# Patient Record
Sex: Female | Born: 1950 | Race: Black or African American | Hispanic: No | Marital: Single | Smoking: Current every day smoker
Health system: Southern US, Community
[De-identification: ages and names within clinical notes are randomized; demographics above are authoritative.]

## PROBLEM LIST (undated history)

## (undated) DIAGNOSIS — I10 Essential (primary) hypertension: Secondary | ICD-10-CM

## (undated) DIAGNOSIS — J45909 Unspecified asthma, uncomplicated: Secondary | ICD-10-CM

## (undated) DIAGNOSIS — J309 Allergic rhinitis, unspecified: Secondary | ICD-10-CM

## (undated) DIAGNOSIS — E785 Hyperlipidemia, unspecified: Secondary | ICD-10-CM

## (undated) HISTORY — PX: TUBAL LIGATION: SHX77

## (undated) HISTORY — PX: ROTATOR CUFF REPAIR: SHX139

## (undated) HISTORY — DX: Allergic rhinitis, unspecified: J30.9

## (undated) HISTORY — PX: ADENOIDECTOMY: SUR15

## (undated) HISTORY — PX: JOINT REPLACEMENT: SHX530

## (undated) HISTORY — PX: TONSILLECTOMY: SUR1361

## (undated) HISTORY — PX: REPLACEMENT TOTAL KNEE: SUR1224

---

## 2012-05-12 ENCOUNTER — Encounter (HOSPITAL_BASED_OUTPATIENT_CLINIC_OR_DEPARTMENT_OTHER): Payer: Self-pay | Admitting: *Deleted

## 2012-05-12 ENCOUNTER — Emergency Department (HOSPITAL_BASED_OUTPATIENT_CLINIC_OR_DEPARTMENT_OTHER)
Admission: EM | Admit: 2012-05-12 | Discharge: 2012-05-13 | Disposition: A | Discharge status: Home / Self Care | Payer: PRIVATE HEALTH INSURANCE | Attending: Emergency Medicine | Admitting: Emergency Medicine

## 2012-05-12 DIAGNOSIS — J069 Acute upper respiratory infection, unspecified: Secondary | ICD-10-CM

## 2012-05-12 DIAGNOSIS — I10 Essential (primary) hypertension: Secondary | ICD-10-CM | POA: Insufficient documentation

## 2012-05-12 DIAGNOSIS — R0982 Postnasal drip: Secondary | ICD-10-CM

## 2012-05-12 DIAGNOSIS — E785 Hyperlipidemia, unspecified: Secondary | ICD-10-CM | POA: Insufficient documentation

## 2012-05-12 DIAGNOSIS — E119 Type 2 diabetes mellitus without complications: Secondary | ICD-10-CM | POA: Insufficient documentation

## 2012-05-12 DIAGNOSIS — F17200 Nicotine dependence, unspecified, uncomplicated: Secondary | ICD-10-CM | POA: Insufficient documentation

## 2012-05-12 HISTORY — DX: Hyperlipidemia, unspecified: E78.5

## 2012-05-12 HISTORY — DX: Essential (primary) hypertension: I10

## 2012-05-12 HISTORY — DX: Unspecified asthma, uncomplicated: J45.909

## 2012-05-12 MED ORDER — IBUPROFEN 400 MG PO TABS
400.0000 mg | ORAL_TABLET | Freq: Once | ORAL | Status: AC
  Administered 2012-05-12: 400 mg via ORAL
  Filled 2012-05-12: qty 1

## 2012-05-12 MED ORDER — DESLORATADINE 5 MG PO TABS: 5.0000 mg | ORAL_TABLET | Freq: Every day | ORAL | Status: DC

## 2012-05-12 MED ORDER — FLUTICASONE PROPIONATE 50 MCG/ACT NA SUSP: 2.0000 | Freq: Every day | NASAL | Status: AC

## 2012-05-12 NOTE — ED Provider Notes (Signed)
History     CSN: [id number]  Arrival date & time 05/12/12  2318   First MD Initiated Contact with Patient 05/12/12 2343      Chief Complaint  Patient presents with  . URI    (Consider location/radiation/quality/duration/timing/severity/associated sxs/prior treatment) Patient is a 61 y.o. female presenting with URI. The history is provided by the patient. No language interpreter was used.  URI The primary symptoms include headaches, ear pain and sore throat. Primary symptoms do not include fever, fatigue, swollen glands, cough, wheezing, abdominal pain, nausea, vomiting, myalgias, arthralgias or rash. The current episode started 3 to 5 days ago. This is a new problem. The problem has not changed since onset. The ear pain began more than 2 days ago. Ear pain is a new problem. The ear pain has been unchanged since its onset. Both ears are affected. The pain is moderate.    Symptoms associated with the illness include plugged ear sensation, congestion and rhinorrhea. The illness is not associated with chills, facial pain or sinus pressure. Risk factors for severe complications from URI include being elderly.    Past Medical History  Diagnosis Date  . Hypertension   . Diabetes mellitus   . Hyperlipemia   . Asthma     Past Surgical History  Procedure Date  . Joint replacement   . Tubal ligation   . Tonsillectomy     History reviewed. No pertinent family history.  History  Substance Use Topics  . Smoking status: Current Some Day Smoker  . Smokeless tobacco: Not on file  . Alcohol Use: No    OB History    Grav Para Term Preterm Abortions TAB SAB Ect Mult Living                  Review of Systems  Constitutional: Negative for fever, chills and fatigue.  HENT: Positive for ear pain, congestion, sore throat and rhinorrhea. Negative for sinus pressure.   Respiratory: Negative for cough and wheezing.   Gastrointestinal: Negative for nausea, vomiting and abdominal pain.    Musculoskeletal: Negative for myalgias and arthralgias.  Skin: Negative for rash.  Neurological: Positive for headaches.  All other systems reviewed and are negative.    Allergies  Review of patient's allergies indicates no known allergies.  Home Medications  No current outpatient prescriptions on file.  BP 164/89  Pulse 91  Temp 98.1 F (36.7 C) (Oral)  Resp 16  Ht 5\' 9"  (1.753 m)  Wt 300 lb (136.079 kg)  BMI 44.30 kg/m2  SpO2 100%  Physical Exam  Constitutional: She is oriented to person, place, and time. She appears well-developed and well-nourished.  HENT:  Head: Normocephalic and atraumatic.  Mouth/Throat: Oropharynx is clear and moist.  Eyes: EOM are normal. Pupils are equal, round, and reactive to light.  Neck: Normal range of motion. Neck supple.  Cardiovascular: Normal rate and regular rhythm.   Pulmonary/Chest: Effort normal and breath sounds normal. She has no wheezes. She has no rales.  Abdominal: Soft. Bowel sounds are normal. There is no tenderness. There is no rebound and no guarding.  Musculoskeletal: Normal range of motion. She exhibits no edema.  Neurological: She is alert and oriented to person, place, and time.  Skin: Skin is warm and dry.  Psychiatric: She has a normal mood and affect.    ED Course  Procedures (including critical care time)  Labs Reviewed - No data to display No results found.   No diagnosis found.    MDM  URI, return for fevers cough productive of sputum inability to swallow or any concerns         Tahlia Deamer K Hamzeh Tall-Rasch, MD 05/12/12 2352

## 2012-05-12 NOTE — ED Notes (Signed)
Pt c/o URI symptoms x 3 days 

## 2012-05-12 NOTE — ED Notes (Signed)
MD at bedside. 

## 2014-03-22 DIAGNOSIS — M1710 Unilateral primary osteoarthritis, unspecified knee: Secondary | ICD-10-CM | POA: Insufficient documentation

## 2014-07-15 DIAGNOSIS — M1711 Unilateral primary osteoarthritis, right knee: Secondary | ICD-10-CM | POA: Insufficient documentation

## 2014-07-15 DIAGNOSIS — D649 Anemia, unspecified: Secondary | ICD-10-CM | POA: Insufficient documentation

## 2014-07-15 DIAGNOSIS — J449 Chronic obstructive pulmonary disease, unspecified: Secondary | ICD-10-CM | POA: Insufficient documentation

## 2015-11-21 DIAGNOSIS — K219 Gastro-esophageal reflux disease without esophagitis: Secondary | ICD-10-CM | POA: Insufficient documentation

## 2015-11-21 DIAGNOSIS — Z794 Long term (current) use of insulin: Secondary | ICD-10-CM

## 2015-11-21 DIAGNOSIS — M150 Primary generalized (osteo)arthritis: Secondary | ICD-10-CM

## 2015-11-21 DIAGNOSIS — M159 Polyosteoarthritis, unspecified: Secondary | ICD-10-CM | POA: Insufficient documentation

## 2015-11-21 DIAGNOSIS — D126 Benign neoplasm of colon, unspecified: Secondary | ICD-10-CM | POA: Insufficient documentation

## 2015-11-21 DIAGNOSIS — E782 Mixed hyperlipidemia: Secondary | ICD-10-CM | POA: Insufficient documentation

## 2015-11-21 DIAGNOSIS — M109 Gout, unspecified: Secondary | ICD-10-CM | POA: Insufficient documentation

## 2015-11-21 DIAGNOSIS — E1142 Type 2 diabetes mellitus with diabetic polyneuropathy: Secondary | ICD-10-CM | POA: Insufficient documentation

## 2015-11-21 DIAGNOSIS — E66813 Obesity, class 3: Secondary | ICD-10-CM | POA: Insufficient documentation

## 2015-11-21 DIAGNOSIS — I10 Essential (primary) hypertension: Secondary | ICD-10-CM | POA: Insufficient documentation

## 2015-11-21 DIAGNOSIS — J45909 Unspecified asthma, uncomplicated: Secondary | ICD-10-CM | POA: Insufficient documentation

## 2015-12-26 DIAGNOSIS — M79672 Pain in left foot: Secondary | ICD-10-CM | POA: Insufficient documentation

## 2015-12-27 DIAGNOSIS — M67972 Unspecified disorder of synovium and tendon, left ankle and foot: Secondary | ICD-10-CM | POA: Insufficient documentation

## 2015-12-27 DIAGNOSIS — M7752 Other enthesopathy of left foot: Secondary | ICD-10-CM | POA: Insufficient documentation

## 2016-01-10 DIAGNOSIS — M76822 Posterior tibial tendinitis, left leg: Secondary | ICD-10-CM | POA: Insufficient documentation

## 2016-01-30 DIAGNOSIS — J309 Allergic rhinitis, unspecified: Secondary | ICD-10-CM | POA: Insufficient documentation

## 2016-03-01 DIAGNOSIS — F17209 Nicotine dependence, unspecified, with unspecified nicotine-induced disorders: Secondary | ICD-10-CM | POA: Insufficient documentation

## 2016-08-20 DIAGNOSIS — N200 Calculus of kidney: Secondary | ICD-10-CM | POA: Insufficient documentation

## 2016-08-20 DIAGNOSIS — K7689 Other specified diseases of liver: Secondary | ICD-10-CM | POA: Insufficient documentation

## 2016-08-20 DIAGNOSIS — D259 Leiomyoma of uterus, unspecified: Secondary | ICD-10-CM | POA: Insufficient documentation

## 2016-10-30 DIAGNOSIS — R0981 Nasal congestion: Secondary | ICD-10-CM | POA: Insufficient documentation

## 2016-12-12 ENCOUNTER — Emergency Department (HOSPITAL_BASED_OUTPATIENT_CLINIC_OR_DEPARTMENT_OTHER)
Admission: EM | Admit: 2016-12-12 | Discharge: 2016-12-12 | Disposition: A | Discharge status: Home / Self Care | Payer: Medicare Other | Attending: Emergency Medicine | Admitting: Emergency Medicine

## 2016-12-12 ENCOUNTER — Encounter (HOSPITAL_BASED_OUTPATIENT_CLINIC_OR_DEPARTMENT_OTHER): Payer: Self-pay | Admitting: *Deleted

## 2016-12-12 DIAGNOSIS — Z7982 Long term (current) use of aspirin: Secondary | ICD-10-CM | POA: Diagnosis not present

## 2016-12-12 DIAGNOSIS — Z79899 Other long term (current) drug therapy: Secondary | ICD-10-CM | POA: Diagnosis not present

## 2016-12-12 DIAGNOSIS — F17200 Nicotine dependence, unspecified, uncomplicated: Secondary | ICD-10-CM | POA: Insufficient documentation

## 2016-12-12 DIAGNOSIS — E119 Type 2 diabetes mellitus without complications: Secondary | ICD-10-CM | POA: Insufficient documentation

## 2016-12-12 DIAGNOSIS — I10 Essential (primary) hypertension: Secondary | ICD-10-CM | POA: Diagnosis not present

## 2016-12-12 DIAGNOSIS — J45909 Unspecified asthma, uncomplicated: Secondary | ICD-10-CM | POA: Diagnosis not present

## 2016-12-12 DIAGNOSIS — Z794 Long term (current) use of insulin: Secondary | ICD-10-CM | POA: Insufficient documentation

## 2016-12-12 DIAGNOSIS — M546 Pain in thoracic spine: Secondary | ICD-10-CM | POA: Insufficient documentation

## 2016-12-12 MED ORDER — METHOCARBAMOL 500 MG PO TABS: 500.0000 mg | ORAL_TABLET | Freq: Two times a day (BID) | ORAL | 0 refills | Status: DC

## 2016-12-12 MED FILL — tiZANidine HCL 4 MG TABS: 4 | 10 days supply | Qty: 20 | Fill #0

## 2016-12-12 NOTE — Discharge Instructions (Signed)
Take the prescribed medication as directed.  Recommend to use heat therapy to help ease muscle tension as well (heating pad, warm compress, warm bath/shower, etc.) Follow-up with your doctor if not improving in the next few days. Return to the ED for new or worsening symptoms.

## 2016-12-12 NOTE — ED Provider Notes (Signed)
[facility] Provider Note   CSN: [id number] Arrival date & time: 12/12/16  0907     History   Chief Complaint Chief Complaint  Patient presents with  . Back Pain    HPI Sharon Morales is a 66 y.o. female.  The history is provided by the patient and medical records.  Back Pain    66 year old female with history of asthma, diabetes, hyperlipidemia, hypertension, presenting to the ED for right mid back pain. States this began on Sunday night. She denies any injury, trauma, or falls. States it feels like she has a "crick" in her back. She was concerned she may have slept wrong. States pain is localized to the right mid back without radiation. She denies any numbness or weakness of her arms or legs. No fever or chills. No urinary symptoms. No abdominal pain. States she has been taking some Tylenol which eases her pain briefly, it is never fully resolved.  Past Medical History:  Diagnosis Date  . Asthma   . Diabetes mellitus   . Hyperlipemia   . Hypertension     There are no active problems to display for this patient.   Past Surgical History:  Procedure Laterality Date  . JOINT REPLACEMENT    . TONSILLECTOMY    . TUBAL LIGATION      OB History    No data available       Home Medications    Prior to Admission medications   Medication Sig Start Date End Date Taking? Authorizing Provider  albuterol (PROVENTIL HFA;VENTOLIN HFA) 108 (90 BASE) MCG/ACT inhaler Inhale 1 puff into the lungs every 4 (four) hours as needed.    Historical Provider, MD  aspirin 81 MG tablet Take 81 mg by mouth daily.    Historical Provider, MD  desloratadine (CLARINEX) 5 MG tablet Take 1 tablet (5 mg total) by mouth daily. 05/12/12 05/12/13  April Palumbo, MD  fluticasone [facility]) 50 MCG/ACT nasal spray Place 2 sprays into the nose daily. 05/12/12 05/12/13  April Palumbo, MD  hydrochlorothiazide (HYDRODIURIL) 25 MG tablet Take 25 mg by mouth daily.    Historical Provider, MD  insulin detemir  (LEVEMIR) 100 UNIT/ML injection Inject 50 Units into the skin at bedtime.    Historical Provider, MD  lisinopril (PRINIVIL,ZESTRIL) 10 MG tablet Take 10 mg by mouth daily.    Historical Provider, MD  metFORMIN (GLUCOPHAGE) 1000 MG tablet Take 1,000 mg by mouth 2 (two) times daily with a meal.    Historical Provider, MD  omeprazole (PRILOSEC) 20 MG capsule Take 20 mg by mouth daily.    Historical Provider, MD  potassium chloride (KLOR-CON) 20 MEQ packet Take 20 mEq by mouth daily.    Historical Provider, MD  rosuvastatin (CRESTOR) 20 MG tablet Take 20 mg by mouth daily.    Historical Provider, MD  zolpidem (AMBIEN) 10 MG tablet Take 10 mg by mouth at bedtime as needed.    Historical Provider, MD    Family History History reviewed. No pertinent family history.  Social History Social History  Substance Use Topics  . Smoking status: Current Some Day Smoker  . Smokeless tobacco: Never Used  . Alcohol use No     Allergies   Patient has no known allergies.   Review of Systems Review of Systems  Musculoskeletal: Positive for back pain.  All other systems reviewed and are negative.    Physical Exam Updated Vital Signs BP 148/77 (BP Location: Right Arm)   Pulse 88   Temp  78 F (36.7 C) (Oral)   Resp 18   SpO2 100%   Physical Exam  Constitutional: She is oriented to person, place, and time. She appears well-developed and well-nourished.  HENT:  Head: Normocephalic and atraumatic.  Mouth/Throat: Oropharynx is clear and moist.  Eyes: Conjunctivae and EOM are normal. Pupils are equal, round, and reactive to light.  Neck: Normal range of motion.  Cardiovascular: Normal rate, regular rhythm and normal heart sounds.   Pulmonary/Chest: Effort normal and breath sounds normal. No respiratory distress. She has no wheezes.  Abdominal: Soft. Bowel sounds are normal. There is no tenderness. There is no rebound and no CVA tenderness.  No abdominal or CVA tenderness  Musculoskeletal: Normal  range of motion.  Muscular tenderness of right thoracic paraspinal muscles; no midline tenderness, deformity, or step-off; full ROM maintained; normal strength and sensation of all 4 extremities, normal gait  Neurological: She is alert and oriented to person, place, and time.  Skin: Skin is warm and dry.  Psychiatric: She has a normal mood and affect.  Nursing note and vitals reviewed.    ED Treatments / Results  Labs (all labs ordered are listed, but only abnormal results are displayed) Labs Reviewed - No data to display  EKG  EKG Interpretation None       Radiology No results found.  Procedures Procedures (including critical care time)  Medications Ordered in ED Medications - No data to display   Initial Impression / Assessment and Plan / ED Course  I have reviewed the triage vital signs and the nursing notes.  Pertinent labs & imaging results that were available during my care of the patient were reviewed by me and considered in my medical decision making (see chart for details).  66 year old female here with atraumatic right thoracic back pain.  Tenderness over the musculature, no midline tenderness or step-off. She is neurologically intact. No radicular type symptoms. She is ambulatory with steady gait. No abdominal pain, CVA tenderness, or urinary symptoms. Patient describes pain as a "crick" in her back. Suspect this is muscular in nature. Will treat symptomatically.  I recommended that she follow-up closely with her primary care doctor if not improving in the next few days.  Discussed plan with patient, she acknowledged understanding and agreed with plan of care.  Return precautions given for new or worsening symptoms.  Final Clinical Impressions(s) / ED Diagnoses   Final diagnoses:  Acute right-sided thoracic back pain    New Prescriptions New Prescriptions   METHOCARBAMOL (ROBAXIN) 500 MG TABLET    Take 1 tablet (500 mg total) by mouth 2 (two) times daily.      Larene Pickett, PA-C 12/12/16 [place], MD 12/12/16 1236

## 2016-12-12 NOTE — ED Triage Notes (Signed)
Pt reports mid back pain x Sunday night, denies any injury or trauma, denies any urinary symptoms. Area is tender to palp.

## 2017-01-16 DIAGNOSIS — H608X3 Other otitis externa, bilateral: Secondary | ICD-10-CM | POA: Insufficient documentation

## 2017-01-27 DIAGNOSIS — H40023 Open angle with borderline findings, high risk, bilateral: Secondary | ICD-10-CM | POA: Insufficient documentation

## 2017-06-18 DIAGNOSIS — L600 Ingrowing nail: Secondary | ICD-10-CM | POA: Insufficient documentation

## 2017-09-10 DIAGNOSIS — J0120 Acute ethmoidal sinusitis, unspecified: Secondary | ICD-10-CM | POA: Insufficient documentation

## 2018-02-01 ENCOUNTER — Encounter (HOSPITAL_BASED_OUTPATIENT_CLINIC_OR_DEPARTMENT_OTHER): Payer: Self-pay | Admitting: Emergency Medicine

## 2018-02-01 ENCOUNTER — Emergency Department (HOSPITAL_BASED_OUTPATIENT_CLINIC_OR_DEPARTMENT_OTHER)
Admission: EM | Admit: 2018-02-01 | Discharge: 2018-02-01 | Disposition: A | Discharge status: Home / Self Care | Payer: Medicare Other | Attending: Emergency Medicine | Admitting: Emergency Medicine

## 2018-02-01 ENCOUNTER — Emergency Department (HOSPITAL_BASED_OUTPATIENT_CLINIC_OR_DEPARTMENT_OTHER): Payer: Medicare Other

## 2018-02-01 ENCOUNTER — Other Ambulatory Visit: Payer: Self-pay

## 2018-02-01 DIAGNOSIS — Z79899 Other long term (current) drug therapy: Secondary | ICD-10-CM | POA: Diagnosis not present

## 2018-02-01 DIAGNOSIS — E119 Type 2 diabetes mellitus without complications: Secondary | ICD-10-CM | POA: Diagnosis not present

## 2018-02-01 DIAGNOSIS — I10 Essential (primary) hypertension: Secondary | ICD-10-CM | POA: Insufficient documentation

## 2018-02-01 DIAGNOSIS — J45909 Unspecified asthma, uncomplicated: Secondary | ICD-10-CM | POA: Insufficient documentation

## 2018-02-01 DIAGNOSIS — Z794 Long term (current) use of insulin: Secondary | ICD-10-CM | POA: Insufficient documentation

## 2018-02-01 DIAGNOSIS — Z7982 Long term (current) use of aspirin: Secondary | ICD-10-CM | POA: Diagnosis not present

## 2018-02-01 DIAGNOSIS — F17200 Nicotine dependence, unspecified, uncomplicated: Secondary | ICD-10-CM | POA: Diagnosis not present

## 2018-02-01 DIAGNOSIS — J209 Acute bronchitis, unspecified: Secondary | ICD-10-CM

## 2018-02-01 DIAGNOSIS — R0602 Shortness of breath: Secondary | ICD-10-CM | POA: Diagnosis present

## 2018-02-01 MED ORDER — AZITHROMYCIN 250 MG PO TABS: 250.0000 mg | ORAL_TABLET | Freq: Every day | ORAL | 0 refills | Status: DC

## 2018-02-01 MED ORDER — AZITHROMYCIN 250 MG PO TABS
500.0000 mg | ORAL_TABLET | Freq: Once | ORAL | Status: AC
  Administered 2018-02-01: 500 mg via ORAL
  Filled 2018-02-01: qty 2

## 2018-02-01 MED ORDER — ALBUTEROL SULFATE (2.5 MG/3ML) 0.083% IN NEBU
2.5000 mg | INHALATION_SOLUTION | Freq: Once | RESPIRATORY_TRACT | Status: AC
  Administered 2018-02-01: 2.5 mg via RESPIRATORY_TRACT
  Filled 2018-02-01: qty 3

## 2018-02-01 MED ORDER — IPRATROPIUM-ALBUTEROL 0.5-2.5 (3) MG/3ML IN SOLN
3.0000 mL | Freq: Once | RESPIRATORY_TRACT | Status: AC
  Administered 2018-02-01: 3 mL via RESPIRATORY_TRACT
  Filled 2018-02-01: qty 3

## 2018-02-01 MED ORDER — ALBUTEROL SULFATE HFA 108 (90 BASE) MCG/ACT IN AERS: 2.0000 | INHALATION_SPRAY | RESPIRATORY_TRACT | 1 refills | Status: AC | PRN

## 2018-02-01 NOTE — ED Triage Notes (Signed)
SOB with productive cough x 2 weeks , pain to mid chest with cough . Was seen at [facility] on Friday for same

## 2018-02-01 NOTE — Discharge Instructions (Addendum)
It was our pleasure to provide your ER care today - we hope that you feel better.  Take zithromax (antibiotic) as prescribed.  Use albuterol inhaler as need if wheezing.   Avoid any smoking.   Follow up with primary care doctor in 1 week if symptoms fail to improve/resolvel  Return to ER if worse, increased trouble breathing, other concern.

## 2018-02-01 NOTE — ED Provider Notes (Signed)
[facility] Provider Note   CSN: [id number] Arrival date & time: 02/01/18  4098     History   Chief Complaint Chief Complaint  Patient presents with  . Shortness of Breath    HPI Sharon Morales is a 67 y.o. female.  Patient c/o cough for past 1-2 weeks, productive, intermittent, worse in past few days. Denies specific exacerbating or alleviating factors. States feels sore with coughing, but otherwise no chest pain or discomfort. Denies sore throat. ?subjective fever. Denies known ill contacts. States went to [facility] ED a couple days ago, but it was so busy she didn't get seen by doctor. Denies recent abx use. +hx asthma, use mdi prn. +smoker.   The history is provided by the patient.  Shortness of Breath  Associated symptoms include a fever and cough. Pertinent negatives include no headaches, no sore throat, no neck pain, no chest pain, no abdominal pain, no rash and no leg swelling.    Past Medical History:  Diagnosis Date  . Asthma   . Diabetes mellitus   . Hyperlipemia   . Hypertension     There are no active problems to display for this patient.   Past Surgical History:  Procedure Laterality Date  . JOINT REPLACEMENT    . TONSILLECTOMY    . TUBAL LIGATION       OB History   None      Home Medications    Prior to Admission medications   Medication Sig Start Date End Date Taking? Authorizing Provider  albuterol (PROVENTIL HFA;VENTOLIN HFA) 108 (90 BASE) MCG/ACT inhaler Inhale 1 puff into the lungs every 4 (four) hours as needed.    [provider]  aspirin 81 MG tablet Take 81 mg by mouth daily.    [provider]  desloratadine (CLARINEX) 5 MG tablet Take 1 tablet (5 mg total) by mouth daily. 05/12/12 05/12/13  Palumbo, April, MD  fluticasone (FLONASE) 50 MCG/ACT nasal spray Place 2 sprays into the nose daily. 05/12/12 05/12/13  Palumbo, April, MD  hydrochlorothiazide (HYDRODIURIL) 25 MG tablet Take 25 mg by mouth daily.     [provider]  insulin detemir (LEVEMIR) 100 UNIT/ML injection Inject 50 Units into the skin at bedtime.    [provider]  lisinopril (PRINIVIL,ZESTRIL) 10 MG tablet Take 10 mg by mouth daily.    [provider]  metFORMIN (GLUCOPHAGE) 1000 MG tablet Take 1,000 mg by mouth 2 (two) times daily with a meal.    [provider]  methocarbamol (ROBAXIN) 500 MG tablet Take 1 tablet (500 mg total) by mouth 2 (two) times daily. 12/12/16   Larene Pickett, PA-C  omeprazole (PRILOSEC) 20 MG capsule Take 20 mg by mouth daily.    [provider]  potassium chloride (KLOR-CON) 20 MEQ packet Take 20 mEq by mouth daily.    [provider]  rosuvastatin (CRESTOR) 20 MG tablet Take 20 mg by mouth daily.    [provider]  zolpidem (AMBIEN) 10 MG tablet Take 10 mg by mouth at bedtime as needed.    [provider]    Family History No family history on file.  Social History Social History   Tobacco Use  . Smoking status: Current Every Day Smoker    Packs/day: 0.50  . Smokeless tobacco: Never Used  Substance Use Topics  . Alcohol use: No  . Drug use: No     Allergies   Sulfa antibiotics; Ace inhibitors; Pioglitazone; Aleve [naproxen sodium];  Ibuprofen; Ketorolac; and Penicillin g   Review of Systems Review of Systems  Constitutional: Positive for fever.  HENT: Negative for sore throat.   Eyes: Negative for redness.  Respiratory: Positive for cough and shortness of breath.   Cardiovascular: Negative for chest pain and leg swelling.  Gastrointestinal: Negative for abdominal pain.  Genitourinary: Negative for flank pain.  Musculoskeletal: Negative for neck pain.  Skin: Negative for rash.  Neurological: Negative for headaches.  Hematological: Does not bruise/bleed easily.  Psychiatric/Behavioral: Negative for confusion.     Physical Exam Updated Vital Signs BP 131/72 (BP Location: Right Arm)   Pulse 93   Temp  98.2 F (36.8 C) (Oral)   Resp (!) 22   Ht 1.803 m (5\' 11" )   Wt (!) 139.3 kg (307 lb)   SpO2 96%   BMI 42.82 kg/m   Physical Exam  Constitutional: She appears well-developed and well-nourished. No distress.  HENT:  Mouth/Throat: Oropharynx is clear and moist.  Eyes: Conjunctivae are normal. No scleral icterus.  Neck: Neck supple. No tracheal deviation present.  Cardiovascular: Normal rate, regular rhythm, normal heart sounds and intact distal pulses. Exam reveals no gallop and no friction rub.  No murmur heard. Pulmonary/Chest: Effort normal. No respiratory distress. She has wheezes.  Abdominal: Soft. Normal appearance and bowel sounds are normal. She exhibits no distension. There is no tenderness.  Genitourinary:  Genitourinary Comments: No cva tenderness  Musculoskeletal: She exhibits no edema.  Neurological: She is alert.  Skin: Skin is warm and dry. No rash noted. She is not diaphoretic.  Psychiatric: She has a normal mood and affect.  Nursing note and vitals reviewed.    ED Treatments / Results  Labs (all labs ordered are listed, but only abnormal results are displayed) Labs Reviewed - No data to display  EKG EKG Interpretation  Date/Time:  Sunday February 01 2018 07:40:04 EDT Ventricular Rate:  85 PR Interval:    QRS Duration: 107 QT Interval:  403 QTC Calculation: 480 R Axis:   -7 Text Interpretation:  Sinus rhythm No previous tracing Confirmed by Lajean Saver [phone]) on 02/01/2018 8:09:05 AM   Radiology Dg Chest 2 View  Result Date: 02/01/2018 CLINICAL DATA:  Cough and chest congestion for the past 2 weeks. Smoker. EXAM: CHEST - 2 VIEW COMPARISON:  None. FINDINGS: Borderline enlarged cardiac silhouette. Tortuous aorta. Linear density at the right lung base. Clear left lung. Bilateral shoulder degenerative changes. Thoracic spine degenerative changes. IMPRESSION: Right basilar linear atelectasis or scarring. Electronically Signed   By: Claudie Revering M.D.   On:  02/01/2018 09:28    Procedures Procedures (including critical care time)  Medications Ordered in ED Medications  ipratropium-albuterol (DUONEB) 0.5-2.5 (3) MG/3ML nebulizer solution 3 mL (3 mLs Nebulization Given 02/01/18 0744)  albuterol (PROVENTIL) (2.5 MG/3ML) 0.083% nebulizer solution 2.5 mg (2.5 mg Nebulization Given 02/01/18 0744)     Initial Impression / Assessment and Plan / ED Course  I have reviewed the triage vital signs and the nursing notes.  Pertinent labs & imaging results that were available during my care of the patient were reviewed by me and considered in my medical decision making (see chart for details).  Ecg. Cxr.   Reviewed nursing notes and prior charts for additional history.   Albuterol neb treatment.   Recheck no wheezing or increased wob.   cxr reviewed - ?bronchitis vs early pna.  zithromax po.  Pt currently appears stable for d/c.     Final Clinical Impressions(s) / ED  Diagnoses   Final diagnoses:  None    ED Discharge Orders    None       Lajean Saver, MD 02/01/18 [phone]

## 2018-02-01 NOTE — ED Notes (Signed)
Patient transported to X-ray 

## 2018-02-19 DIAGNOSIS — H4010X1 Unspecified open-angle glaucoma, mild stage: Secondary | ICD-10-CM | POA: Insufficient documentation

## 2018-04-20 ENCOUNTER — Encounter: Payer: Self-pay | Admitting: Pediatrics

## 2018-04-20 ENCOUNTER — Ambulatory Visit (INDEPENDENT_AMBULATORY_CARE_PROVIDER_SITE_OTHER): Payer: Medicare Other | Admitting: Pediatrics

## 2018-04-20 VITALS — BP 128/70 | HR 86 | Temp 98.3°F | Resp 20 | Ht 67.2 in | Wt 301.2 lb

## 2018-04-20 DIAGNOSIS — L500 Allergic urticaria: Secondary | ICD-10-CM

## 2018-04-20 DIAGNOSIS — J301 Allergic rhinitis due to pollen: Secondary | ICD-10-CM | POA: Diagnosis not present

## 2018-04-20 DIAGNOSIS — I10 Essential (primary) hypertension: Secondary | ICD-10-CM

## 2018-04-20 DIAGNOSIS — H4010X1 Unspecified open-angle glaucoma, mild stage: Secondary | ICD-10-CM

## 2018-04-20 DIAGNOSIS — Z720 Tobacco use: Secondary | ICD-10-CM | POA: Diagnosis not present

## 2018-04-20 DIAGNOSIS — J4540 Moderate persistent asthma, uncomplicated: Secondary | ICD-10-CM

## 2018-04-20 MED ORDER — TRIAMCINOLONE ACETONIDE 0.1 % EX OINT: TOPICAL_OINTMENT | CUTANEOUS | 3 refills | Status: AC

## 2018-04-20 MED ORDER — CETIRIZINE HCL 10 MG PO TABS: ORAL_TABLET | ORAL | 5 refills | Status: AC

## 2018-04-20 NOTE — Progress Notes (Signed)
[place] [address] Dept: [phone]  New Patient Note  Patient ID: Sharon Morales, female    DOB: [birth year]  Age: 67 y.o. MRN: [id number] Date of Office Visit: 04/20/2018 Referring provider: Vernie Ammons, MD [address] [place], [place] [address]    Chief Complaint: Rash  HPI Audianna Landgren presents for evaluation of an itchy rash which she has had for 2 months. She does have a history of hives from Aleve and  Ibuprofen . She has seen a dermatologist for this rash. He referred her to [place] for an allergy evaluation. The rash began after she took out some furniture that had been  in storage. The couch has been cleaned. .She has a history of sinus problems for several years. She also has a history of asthma. She has aggravation of her nasal symptoms on exposure to dust , cats  and cigarette smoke.Marland Kitchen She has very mild glaucoma. She has been smoking cigarettes for about 40 years averaging half a pack per day . She has adult-onset diabetes well controlled  Review of Systems  Constitutional: Negative.   HENT:       Sinus problems for several years. History of tonsillectomy and adenoidectomy  Eyes:       Mild acute angle glaucoma  Respiratory:       History of asthma. pneumonia in 2013  Cardiovascular:       Hypertension  Gastrointestinal:       Gastroesophageal reflux  Genitourinary:       History of kidney stones  Musculoskeletal:       Knee replacements. Rotator cuff repair  Skin:       Itchy rash for 2 months  Neurological: Negative.   Endo/Heme/Allergies:       No thyroid disease. Type 2 diabetes since 2012  Psychiatric/Behavioral: Negative.     Outpatient Encounter Medications as of 04/20/2018  Medication Sig  . albuterol (PROVENTIL HFA;VENTOLIN HFA) 108 (90 BASE) MCG/ACT inhaler Inhale 1 puff into the lungs every 4 (four) hours as needed.  Marland Kitchen albuterol (PROVENTIL HFA;VENTOLIN HFA) 108 (90 Base) MCG/ACT inhaler Inhale 2 puffs into the lungs every 4  (four) hours as needed for wheezing or shortness of breath.  Marland Kitchen aspirin 81 MG tablet Take 81 mg by mouth daily.  . carvedilol (COREG) 12.5 MG tablet Take 12.5 mg by mouth 2 (two) times daily with a meal.  . cetirizine (ZYRTEC) 10 MG tablet One tablet one to two times a day if needed for itching  . Dulaglutide (TRULICITY) 1.5 DT/2.6ZT SOPN Inject into the skin.  . fluticasone (FLONASE) 50 MCG/ACT nasal spray Place 2 sprays into the nose daily.  . Fluticasone-Salmeterol (ADVAIR DISKUS) 250-50 MCG/DOSE AEPB Inhale 1 puff into the lungs 2 (two) times daily.  Marland Kitchen gabapentin (NEURONTIN) 300 MG capsule Take 300 mg by mouth 3 (three) times daily.  . hydrochlorothiazide (HYDRODIURIL) 25 MG tablet Take 25 mg by mouth daily.  . insulin detemir (LEVEMIR) 100 UNIT/ML injection Inject 50 Units into the skin at bedtime.  Marland Kitchen lisinopril (PRINIVIL,ZESTRIL) 10 MG tablet Take 10 mg by mouth daily.  Marland Kitchen losartan (COZAAR) 25 MG tablet Take 25 mg by mouth daily.  . metFORMIN (GLUCOPHAGE) 1000 MG tablet Take 1,000 mg by mouth 2 (two) times daily with a meal.  . omeprazole (PRILOSEC) 20 MG capsule Take 20 mg by mouth daily.  . potassium chloride (KLOR-CON) 20 MEQ packet Take 20 mEq by mouth daily.  . rosuvastatin (CRESTOR) 20 MG tablet Take 20 mg  by mouth daily.  Marland Kitchen triamcinolone ointment (KENALOG) 0.1 % Apply twice a day to red itchy areas.  . zolpidem (AMBIEN) 10 MG tablet Take 10 mg by mouth at bedtime as needed.  . [DISCONTINUED] azithromycin (ZITHROMAX Z-PAK) 250 MG tablet Take 1 tablet (250 mg total) by mouth daily. Take as directed  . [DISCONTINUED] cetirizine (ZYRTEC) 10 MG tablet Take 10 mg by mouth daily.  . [DISCONTINUED] methocarbamol (ROBAXIN) 500 MG tablet Take 1 tablet (500 mg total) by mouth 2 (two) times daily.  . [DISCONTINUED] triamcinolone ointment (KENALOG) 0.1 % Apply 1 application topically 2 (two) times daily.  . [DISCONTINUED] desloratadine (CLARINEX) 5 MG tablet Take 1 tablet (5 mg total) by mouth  daily.   No facility-administered encounter medications on file as of 04/20/2018.      Drug Allergies:  Allergies  Allergen Reactions  . Sulfa Antibiotics Hives  . Ace Inhibitors Cough  . Pioglitazone Other (See Comments)  . Aleve [Naproxen Sodium] Rash  . Ibuprofen Rash  . Ketorolac Nausea And Vomiting  . Penicillin G Rash    Rash with augmentin    Family History: Francesca's family history is not on file..family history is positive for hay fever, sinus problems and chronic bronchitis. Family history is negative for asthma, angioedema, eczema, hives, food allergies, lupus  Social and environmental. There are no pets in the home She smokes cigarettes. She has smoked cigarettes for 40 years averaging about a half a pack per day. She is not employed.  Physical Exam: BP 128/70 (BP Location: Left Arm, Patient Position: Sitting, Cuff Size: Large)   Pulse 86   Temp 98.3 F (36.8 C) (Oral)   Resp 20   Ht 5' 7.2" (1.707 m)   Wt (!) 301 lb 3.2 oz (136.6 kg)   BMI 46.89 kg/m    Physical Exam  Constitutional: She is oriented to person, place, and time. She appears well-developed and well-nourished.  HENT:  Sterile normal. Ears normal. Nose mild swelling of nasal turbinates. Pharynx normal  Neck: Neck supple. No thyromegaly present.  Cardiovascular:  S1 and S2 normal, no murmurs  Pulmonary/Chest:  Clear to percussion and auscultation  Abdominal: Soft. There is no tenderness (no hepatosplenomegaly).  Lymphadenopathy:    She has no cervical adenopathy.  Neurological: She is alert and oriented to person, place, and time.  Skin:  There were a few pinpoint erythematous areas  Psychiatric: She has a normal mood and affect. Her behavior is normal. Judgment and thought content normal.  Vitals reviewed.   Diagnostics: FVC 2.07 L FEV1 1.55 L. Redictated FVC 2.83 L predicted FEV1 2.21 L.After albuterol 2 puffs FVC 2.01 L FEV1 1.53 L-this shows a mild reduction in the forced vital capacity.  There was no significant improvement after albuterol  Allergy skin testing showed mild reactivity to grass pollen, ragweed pollen, tree pollens, molds and cat . Skin testing to foods was negative   Assessment  Assessment and Plan: 1. Moderate persistent asthma without complication   2. Allergic urticaria   3. Allergic rhinitis due to pollen, unspecified seasonality   4. Tobacco use   5. Essential hypertension   6. Open-angle glaucoma of both eyes, mild stage, unspecified open-angle glaucoma type   7.       Type 2 diabetes well controlled  Meds ordered this encounter  Medications  . cetirizine (ZYRTEC) 10 MG tablet    Sig: One tablet one to two times a day if needed for itching    Dispense:  60  tablet    Refill:  5  . triamcinolone ointment (KENALOG) 0.1 %    Sig: Apply twice a day to red itchy areas.    Dispense:  453.6 g    Refill:  3    Patient Instructions  Environmental control of dust and mold Zyrtec 10 mg once or twice a day if needed for itching Fluticasone 2 sprays per nostril once a day if needed for stuffy nose Advair DISKUS  250-50 MCG's 1 puff every 12 hours to prevent coughing or wheezing ProAir - 2 puffs every 4 hours if needed for wheezing or coughing spells Triamcinolone 0.1% cream twice a day if needed to red itchy areas Add prednisone 10 mg twice a day for 4 days, 10 mg on the fifth day . Did it help the rash ? Do f oods with salicylates make you itch or break out ? Continue on your  other medications Call us if you are not doing well on this treatment plan Stop smoking cigarettes   Return in about 1 month (around 05/18/2018).   Thank you for the opportunity to care for this patient.  Please do not hesitate to contact me with questions.  Penne Lash, M.D.  [facility] [address] [place], [place] [address] [phone]

## 2018-04-20 NOTE — Patient Instructions (Addendum)
Environmental control of dust and mold Zyrtec 10 mg once or twice a day if needed for itching Fluticasone 2 sprays per nostril once a day if needed for stuffy nose Advair DISKUS  250-50 MCG's 1 puff every 12 hours to prevent coughing or wheezing ProAir - 2 puffs every 4 hours if needed for wheezing or coughing spells Triamcinolone 0.1% cream twice a day if needed to red itchy areas Add prednisone 10 mg twice a day for 4 days, 10 mg on the fifth day . Did it help the rash ? Do f oods with salicylates make you itch or break out ? Continue on your  other medications Call us if you are not doing well on this treatment plan Stop smoking cigarettes

## 2018-06-02 ENCOUNTER — Ambulatory Visit: Payer: Medicare Other | Admitting: Pediatrics

## 2019-07-21 IMAGING — CR DG CHEST 2V
2 series · 2 of 2 positions shown · non-contrast
Comparison: None.

CLINICAL DATA: Cough and chest congestion for the past 2 weeks.
Smoker.

EXAM:
CHEST - 2 VIEW

[w chest pa]
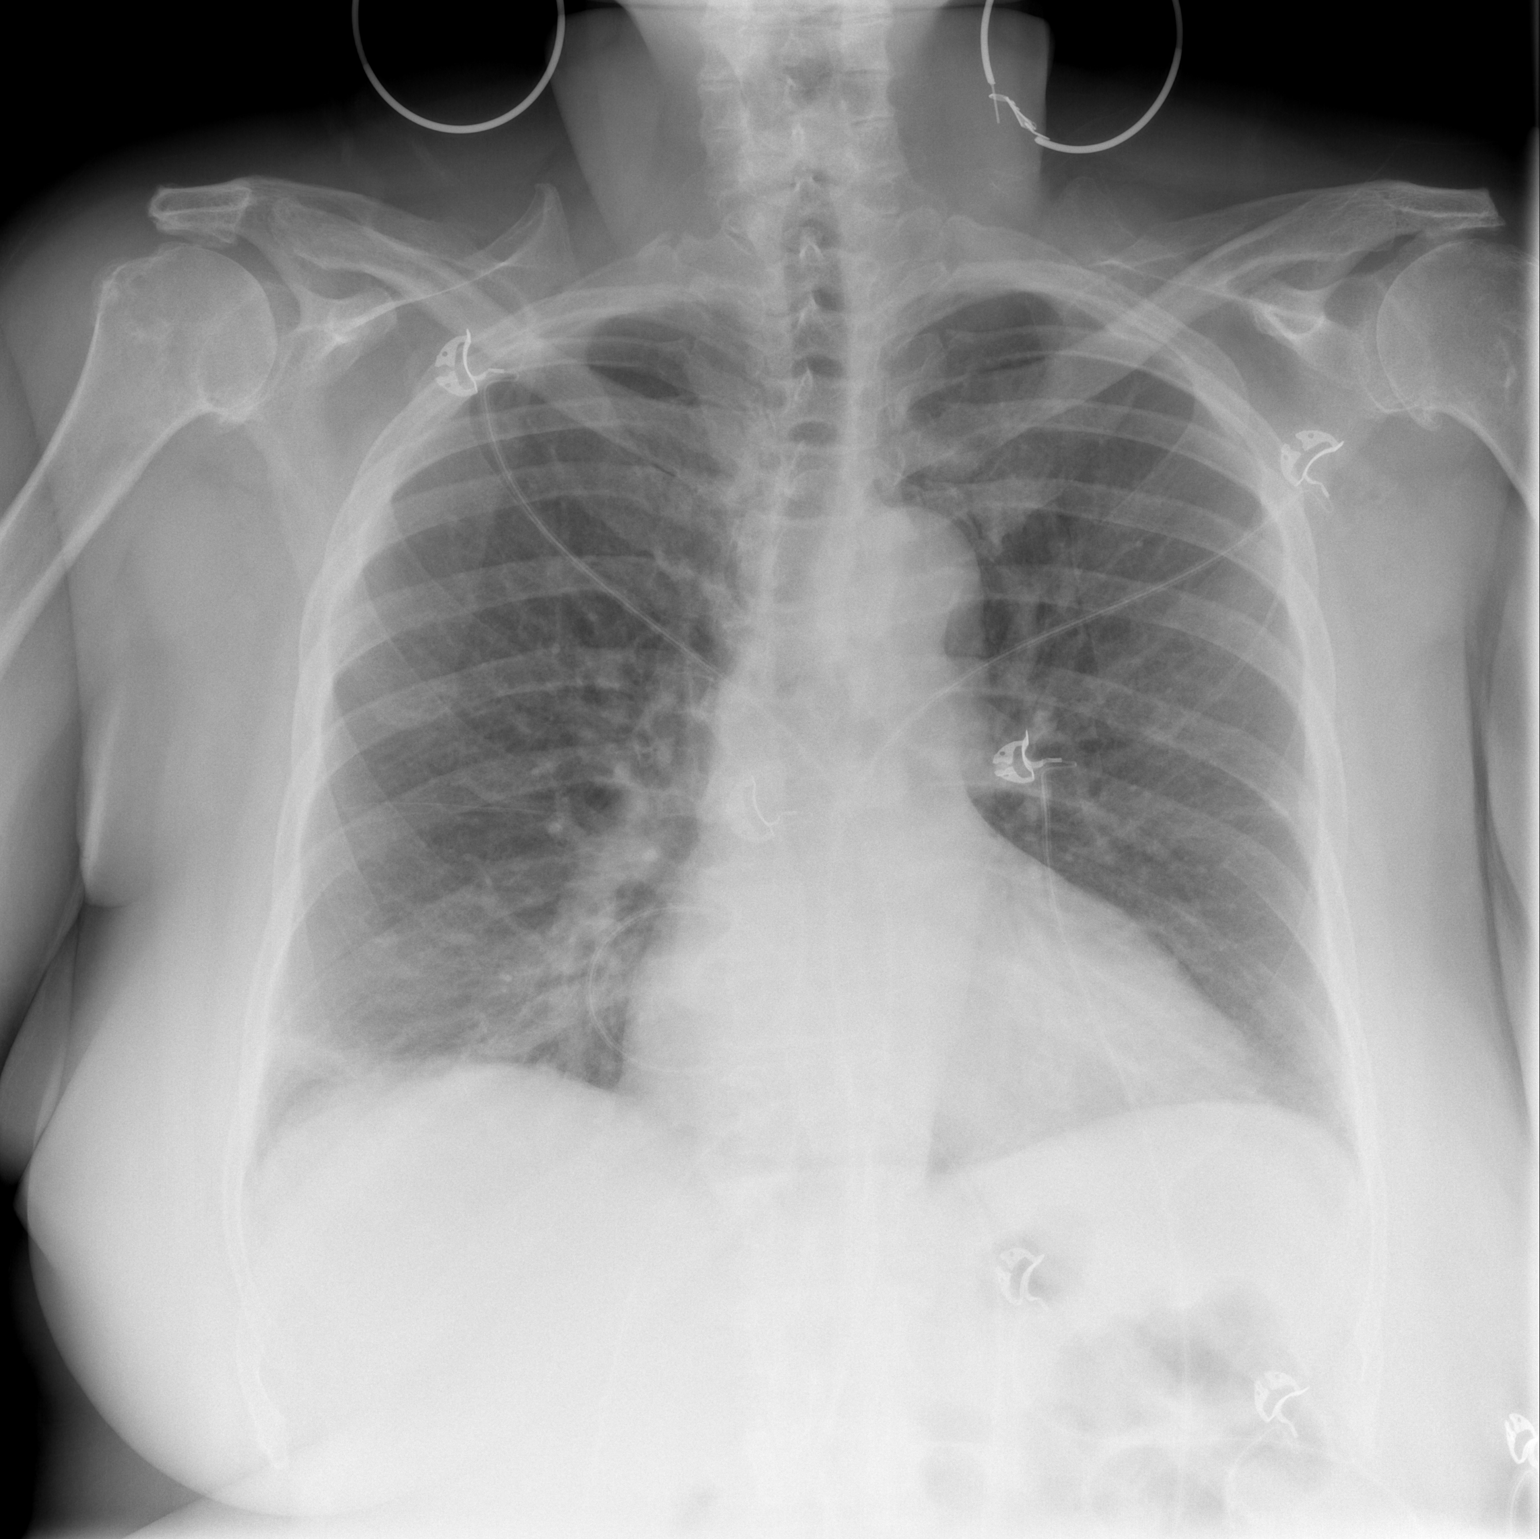

[w chest lat]
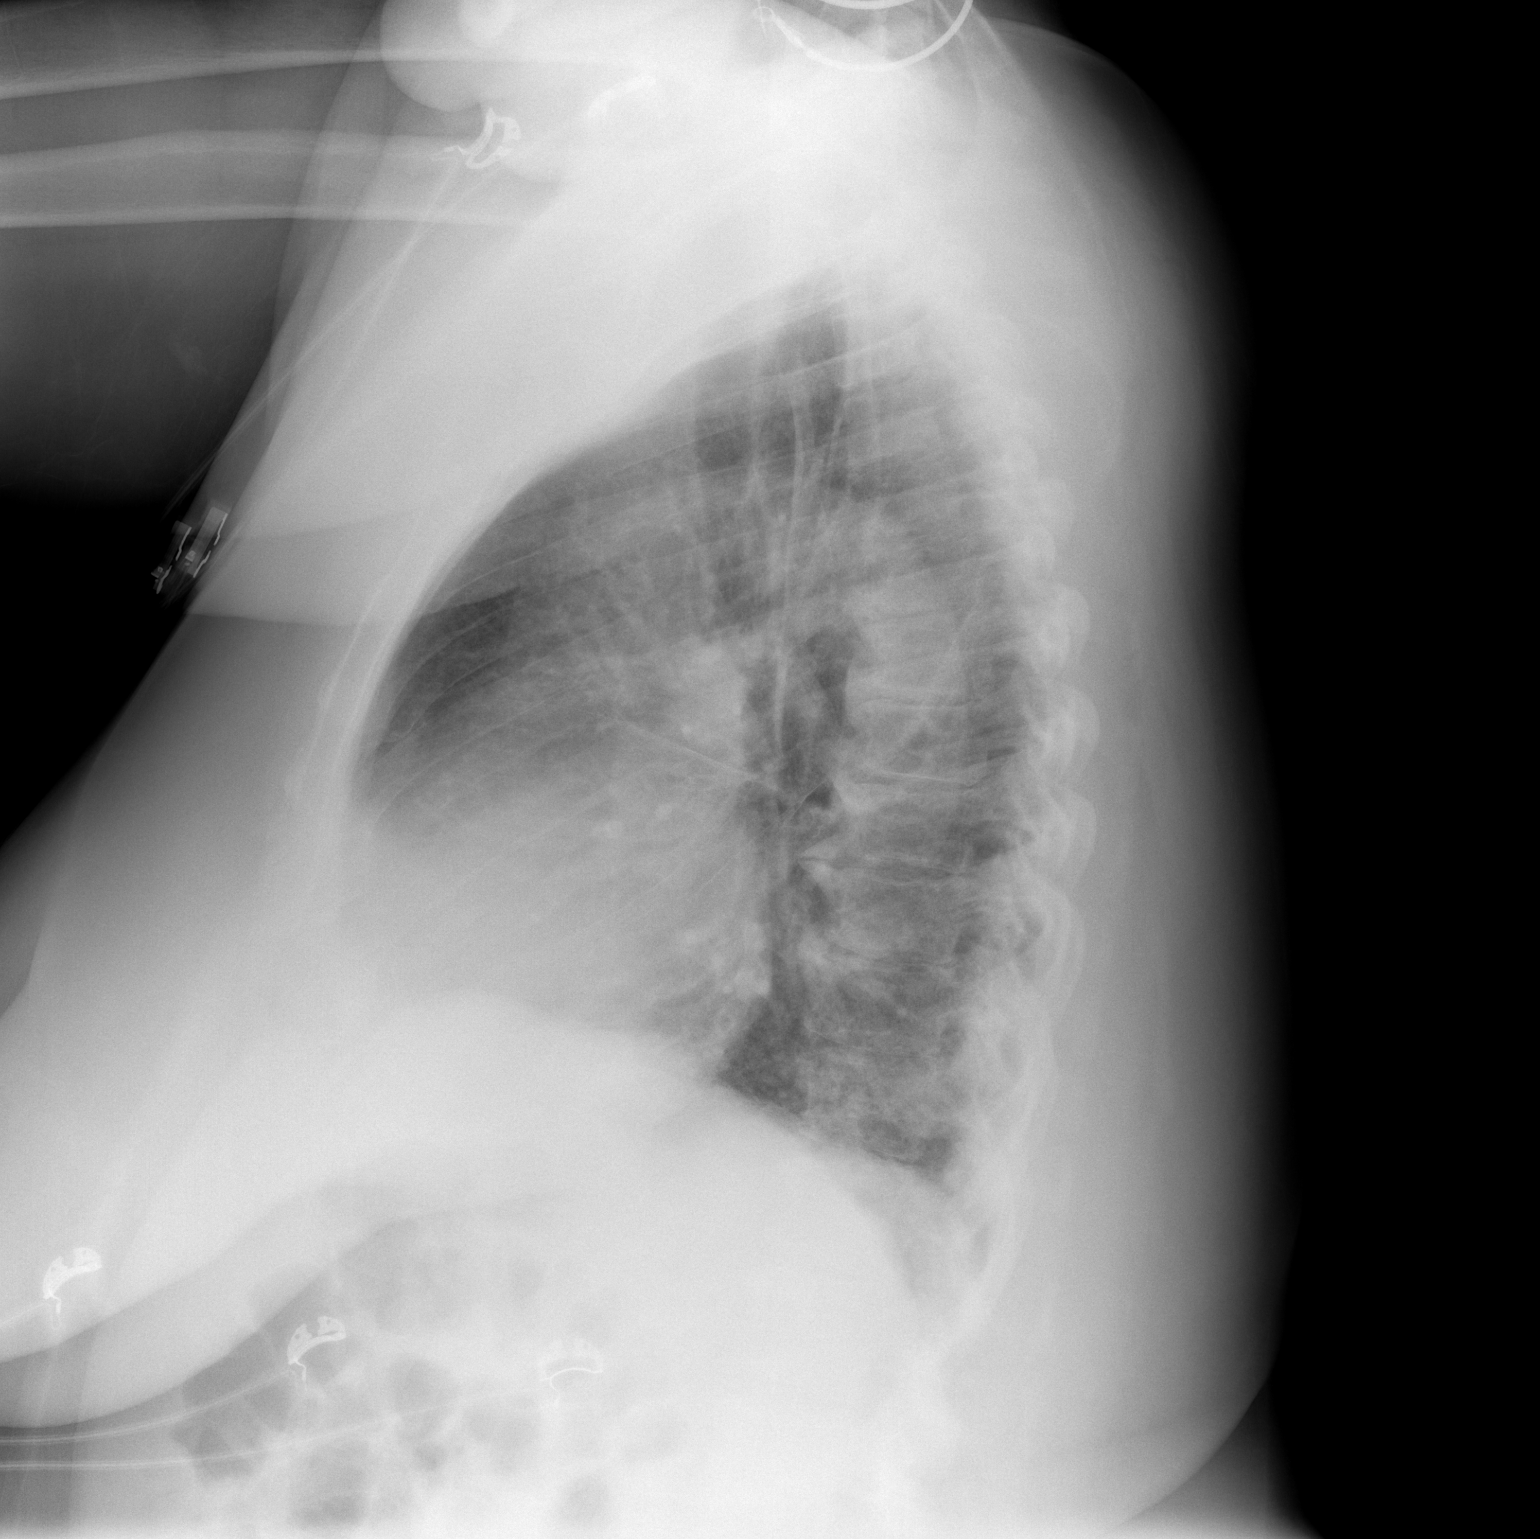

[2 of 2 positions shown; findings below may reference images not displayed]

FINDINGS: Borderline enlarged cardiac silhouette. Tortuous aorta. Linear
density at the right lung base. Clear left lung. Bilateral shoulder
degenerative changes. Thoracic spine degenerative changes.
IMPRESSION: Right basilar linear atelectasis or scarring.
